# Patient Record
Sex: Female | Born: 1985 | Race: White | Hispanic: No | Marital: Single | Smoking: Never smoker
Health system: Southern US, Community
[De-identification: ages and names within clinical notes are randomized; demographics above are authoritative.]

## PROBLEM LIST (undated history)

## (undated) DIAGNOSIS — M069 Rheumatoid arthritis, unspecified: Secondary | ICD-10-CM

## (undated) HISTORY — PX: WISDOM TOOTH EXTRACTION: SHX21

---

## 2013-05-25 ENCOUNTER — Encounter (HOSPITAL_COMMUNITY): Payer: Self-pay | Admitting: Emergency Medicine

## 2013-05-25 ENCOUNTER — Emergency Department (HOSPITAL_COMMUNITY)
Admission: EM | Admit: 2013-05-25 | Discharge: 2013-05-25 | Disposition: A | Discharge status: Home / Self Care | Payer: Self-pay | Attending: Emergency Medicine | Admitting: Emergency Medicine

## 2013-05-25 DIAGNOSIS — M069 Rheumatoid arthritis, unspecified: Secondary | ICD-10-CM | POA: Insufficient documentation

## 2013-05-25 DIAGNOSIS — IMO0002 Reserved for concepts with insufficient information to code with codable children: Secondary | ICD-10-CM | POA: Insufficient documentation

## 2013-05-25 DIAGNOSIS — Z79899 Other long term (current) drug therapy: Secondary | ICD-10-CM | POA: Insufficient documentation

## 2013-05-25 DIAGNOSIS — M79642 Pain in left hand: Secondary | ICD-10-CM

## 2013-05-25 HISTORY — DX: Rheumatoid arthritis, unspecified: M06.9

## 2013-05-25 MED ORDER — PREDNISONE 20 MG PO TABS: 40.0000 mg | ORAL_TABLET | Freq: Every day | ORAL | Status: DC

## 2013-05-25 MED ORDER — IBUPROFEN 800 MG PO TABS
800.0000 mg | ORAL_TABLET | Freq: Once | ORAL | Status: AC
  Administered 2013-05-25: 800 mg via ORAL
  Filled 2013-05-25: qty 1

## 2013-05-25 MED ORDER — ACETAMINOPHEN 500 MG PO TABS: 1000.0000 mg | ORAL_TABLET | Freq: Once | ORAL | Status: DC

## 2013-05-25 MED ORDER — IBUPROFEN 800 MG PO TABS: 800.0000 mg | ORAL_TABLET | Freq: Once | ORAL | Status: DC

## 2013-05-25 MED ORDER — ACETAMINOPHEN 500 MG PO TABS
1000.0000 mg | ORAL_TABLET | Freq: Once | ORAL | Status: AC
  Administered 2013-05-25: 1000 mg via ORAL
  Filled 2013-05-25: qty 2

## 2013-05-25 MED ORDER — DEXAMETHASONE SODIUM PHOSPHATE 10 MG/ML IJ SOLN
10.0000 mg | Freq: Once | INTRAMUSCULAR | Status: AC
  Administered 2013-05-25: 10 mg via INTRAMUSCULAR
  Filled 2013-05-25: qty 1

## 2013-05-25 NOTE — ED Notes (Addendum)
Alert, NAD, calm, interactive, pt given warm packs per request, declined ice pack at this time, wanted to try heat tx. 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

## 2013-05-25 NOTE — ED Notes (Addendum)
C/o L hand pain, onset ~ 2 weeks ago, c/w past RA flare ups, some tingling, denies other sx, has tried ibuprofen, hydrocodone, aspercreme, ice, heat. Rates pain 7/10. Describes as crushing. Pinpoints pain to L index, radiates to hand and up to mid FA. Some swelling of L index noted, no s/sx of infection. Denies fever. No redness, bruising or other markings noted. Denies injury. H/o similar.

## 2013-05-25 NOTE — ED Provider Notes (Signed)
CSN: [id number]     Arrival date & time 05/25/13  0605 History   First MD Initiated Contact with Patient 05/25/13 [phone]     Chief Complaint  Patient presents with  . Hand Pain   (Consider location/radiation/quality/duration/timing/severity/associated sxs/prior Treatment) HPI Pt is a 27yo female with hx of rheumatoid arthritis c/o left hand pain that has progressively worsened over the last month, worse within the 1-2 weeks.  Symptoms worst in left index finger. Pain is constant, crushing, 7/10, worse with movement and palpation, associated with intermittent tingling. Pain radiates on occasion all the way to left shoulder.  Also associated with moderate swelling of left index finger, decreased ROM.  Has tried ibuprofen, hydrocodone, aspercreme, ice and heat with minimal relief. Pt is right handed but states she uses left index finger often to help hold her cell phone.  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  States she normally has flares in her right hand, last one was several years ago and required joint injections. Denies fever, n/v/d. Denies recent injury or fall.  Past Medical History  Diagnosis Date  . Rheumatoid arthritis    Past Surgical History  Procedure Laterality Date  . Wisdom tooth extraction     No family history on file. History  Substance Use Topics  . Smoking status: Never Smoker   . Smokeless tobacco: Not on file  . Alcohol Use: Yes   OB History   Grav Para Term Preterm Abortions TAB SAB Ect Mult Living                 Review of Systems  Constitutional: Negative for fever, chills and fatigue.  Musculoskeletal: Positive for arthralgias, joint swelling and myalgias.       Left hand, worse in index finger.  Skin: Negative for color change, rash and wound.  All other systems reviewed and are negative.    Allergies  Review of patient's allergies indicates no known allergies.  Home Medications    Current Outpatient Rx  Name  Route  Sig  Dispense  Refill  . HYDROcodone-acetaminophen (NORCO) 7.5-325 MG per tablet   Oral   Take 1 tablet by mouth every 6 (six) hours as needed for pain.         Marland Kitchen ibuprofen (ADVIL,MOTRIN) 800 MG tablet   Oral   Take 800 mg by mouth every 8 (eight) hours as needed for pain.         Marland Kitchen acetaminophen (TYLENOL) 500 MG tablet   Oral   Take 2 tablets (1,000 mg total) by mouth once.   30 tablet   0   . ibuprofen (ADVIL,MOTRIN) 800 MG tablet   Oral   Take 1 tablet (800 mg total) by mouth once.   30 tablet   0   . predniSONE (DELTASONE) 20 MG tablet   Oral   Take 2 tablets (40 mg total) by mouth daily.   10 tablet   0    BP 126/81  Pulse 98  Temp(Src) 98.1 F (36.7 C) (Oral)  Resp 16  Ht 5\' 2"  (1.575 m)  Wt 166 lb (75.297 kg)  BMI 30.35 kg/m2  SpO2 100%  LMP 05/01/2013 Physical Exam  Nursing note and vitals reviewed. Constitutional: She is oriented to person, place, and time. She appears well-developed and well-nourished.  HENT:  Head: Normocephalic and atraumatic.  Eyes: EOM are normal.  Neck: Normal range of motion.  Cardiovascular: Normal rate.  Pulmonary/Chest: Effort normal.  Musculoskeletal: She exhibits edema and tenderness.       Left hand: She exhibits decreased range of motion and tenderness. She exhibits normal capillary refill, no deformity and no laceration. Decreased strength noted. She exhibits thumb/finger opposition.       Hands: Moderate edema with mild erythema and warmth of left index finger, TTP. Difficulty with index to thumb opposition.  Limited finger flexion due to pain and swelling.  TTP along thumb and middle finger. No pain in anatomical snuff box.  No deformity. No ecchymosis.  Neurological: She is alert and oriented to person, place, and time.  Skin: Skin is warm and dry.  Psychiatric: She has a normal mood and affect. Her behavior is normal.    ED Course  Procedures (including critical care  time) Labs Review Labs Reviewed - No data to display Imaging Review No results found.  EKG Interpretation   None       MDM   1. Rheumatoid arthritis flare   2. Left hand pain    Pt presenting with flare up of RA in left index finger.  Denies fever, n/v/d. Denies injury to hand.  Report symptoms consistent with previous RA flares.  Not concerned for fracture, dislocation, or soft tissue infection. 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 Tx in ED: decadron, ibuprofen, acetaminophen.    Rx: acetaminophen, ibuprofen, prednisone x 5 days.  All questions answered and concerns addressed. Will discharge pt home and have pt f/u with [facility] info provided. Return precautions given. Pt verbalized understanding and agreement with tx plan. Vitals: unremarkable. Discharged in stable condition.    Discussed pt with attending during ED encounter and agrees with plan.      Junius Finner, PA-C 05/25/13 [phone]

## 2013-05-27 NOTE — ED Provider Notes (Signed)
Medical screening examination/treatment/procedure(s) were performed by non-physician practitioner and as supervising physician I was immediately available for consultation/collaboration.   Loren Racer, MD 05/27/13 1537

## 2013-07-20 ENCOUNTER — Encounter (HOSPITAL_COMMUNITY): Payer: Self-pay | Admitting: Emergency Medicine

## 2013-07-20 ENCOUNTER — Emergency Department (HOSPITAL_COMMUNITY)
Admission: EM | Admit: 2013-07-20 | Discharge: 2013-07-21 | Disposition: A | Discharge status: Home / Self Care | Payer: No Typology Code available for payment source | Attending: Emergency Medicine | Admitting: Emergency Medicine

## 2013-07-20 ENCOUNTER — Emergency Department (HOSPITAL_COMMUNITY): Payer: No Typology Code available for payment source

## 2013-07-20 DIAGNOSIS — S63509A Unspecified sprain of unspecified wrist, initial encounter: Secondary | ICD-10-CM | POA: Insufficient documentation

## 2013-07-20 DIAGNOSIS — IMO0002 Reserved for concepts with insufficient information to code with codable children: Secondary | ICD-10-CM | POA: Insufficient documentation

## 2013-07-20 DIAGNOSIS — S0300XA Dislocation of jaw, unspecified side, initial encounter: Secondary | ICD-10-CM

## 2013-07-20 DIAGNOSIS — Y92410 Unspecified street and highway as the place of occurrence of the external cause: Secondary | ICD-10-CM | POA: Insufficient documentation

## 2013-07-20 DIAGNOSIS — Z8739 Personal history of other diseases of the musculoskeletal system and connective tissue: Secondary | ICD-10-CM | POA: Insufficient documentation

## 2013-07-20 DIAGNOSIS — S0990XA Unspecified injury of head, initial encounter: Secondary | ICD-10-CM | POA: Insufficient documentation

## 2013-07-20 DIAGNOSIS — S43401A Unspecified sprain of right shoulder joint, initial encounter: Secondary | ICD-10-CM

## 2013-07-20 DIAGNOSIS — Y9389 Activity, other specified: Secondary | ICD-10-CM | POA: Insufficient documentation

## 2013-07-20 NOTE — ED Notes (Signed)
Patient transported to X-ray 
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
## 2013-07-21 MED ORDER — NAPROXEN 500 MG PO TABS: 500.0000 mg | ORAL_TABLET | Freq: Two times a day (BID) | ORAL | Status: AC

## 2013-07-21 MED ORDER — DIAZEPAM 5 MG PO TABS: 5.0000 mg | ORAL_TABLET | Freq: Three times a day (TID) | ORAL | Status: DC | PRN

## 2013-07-21 MED ORDER — HYDROCODONE-ACETAMINOPHEN 5-325 MG PO TABS
1.0000 | ORAL_TABLET | Freq: Once | ORAL | Status: AC
  Administered 2013-07-21: 1 via ORAL
  Filled 2013-07-21: qty 1

## 2013-07-21 MED ORDER — TRAMADOL HCL 50 MG PO TABS: 50.0000 mg | ORAL_TABLET | Freq: Four times a day (QID) | ORAL | Status: DC | PRN

## 2013-07-21 NOTE — ED Provider Notes (Signed)
Medical screening examination/treatment/procedure(s) were performed by non-physician practitioner and as supervising physician I was immediately available for consultation/collaboration.  EKG Interpretation   None         Megan E Docherty, MD 07/21/13 0113 

## 2013-07-28 ENCOUNTER — Emergency Department (HOSPITAL_COMMUNITY)
Admission: EM | Admit: 2013-07-28 | Discharge: 2013-07-28 | Disposition: A | Discharge status: Home / Self Care | Payer: No Typology Code available for payment source | Attending: Emergency Medicine | Admitting: Emergency Medicine

## 2013-07-28 ENCOUNTER — Encounter (HOSPITAL_COMMUNITY): Payer: Self-pay | Admitting: Emergency Medicine

## 2013-07-28 DIAGNOSIS — R6884 Jaw pain: Secondary | ICD-10-CM | POA: Insufficient documentation

## 2013-07-28 DIAGNOSIS — Z79899 Other long term (current) drug therapy: Secondary | ICD-10-CM | POA: Insufficient documentation

## 2013-07-28 DIAGNOSIS — S63501S Unspecified sprain of right wrist, sequela: Secondary | ICD-10-CM

## 2013-07-28 DIAGNOSIS — IMO0002 Reserved for concepts with insufficient information to code with codable children: Secondary | ICD-10-CM | POA: Insufficient documentation

## 2013-07-28 DIAGNOSIS — Y939 Activity, unspecified: Secondary | ICD-10-CM | POA: Insufficient documentation

## 2013-07-28 DIAGNOSIS — S46911S Strain of unspecified muscle, fascia and tendon at shoulder and upper arm level, right arm, sequela: Secondary | ICD-10-CM

## 2013-07-28 DIAGNOSIS — S034XXS Sprain of jaw, sequela: Secondary | ICD-10-CM

## 2013-07-28 DIAGNOSIS — S63509A Unspecified sprain of unspecified wrist, initial encounter: Secondary | ICD-10-CM | POA: Insufficient documentation

## 2013-07-28 DIAGNOSIS — S0340XA Sprain of jaw, unspecified side, initial encounter: Secondary | ICD-10-CM | POA: Insufficient documentation

## 2013-07-28 DIAGNOSIS — M069 Rheumatoid arthritis, unspecified: Secondary | ICD-10-CM | POA: Insufficient documentation

## 2013-07-28 DIAGNOSIS — Y92410 Unspecified street and highway as the place of occurrence of the external cause: Secondary | ICD-10-CM | POA: Insufficient documentation

## 2013-07-28 MED ORDER — DIAZEPAM 5 MG PO TABS: 5.0000 mg | ORAL_TABLET | Freq: Two times a day (BID) | ORAL | Status: AC

## 2013-07-28 MED ORDER — TRAMADOL HCL 50 MG PO TABS: 50.0000 mg | ORAL_TABLET | Freq: Four times a day (QID) | ORAL | Status: DC | PRN
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
## 2013-08-05 NOTE — ED Provider Notes (Signed)
Medical screening examination/treatment/procedure(s) were performed by non-physician practitioner and as supervising physician I was immediately available for consultation/collaboration.  EKG Interpretation   None        Raeford Razor, MD 08/05/13 1710

## 2014-01-17 ENCOUNTER — Encounter (HOSPITAL_COMMUNITY): Payer: Self-pay | Admitting: Emergency Medicine

## 2014-01-17 ENCOUNTER — Emergency Department (HOSPITAL_COMMUNITY)
Admission: EM | Admit: 2014-01-17 | Discharge: 2014-01-17 | Disposition: A | Discharge status: Home / Self Care | Payer: No Typology Code available for payment source | Attending: Emergency Medicine | Admitting: Emergency Medicine

## 2014-01-17 ENCOUNTER — Emergency Department (HOSPITAL_COMMUNITY): Payer: No Typology Code available for payment source

## 2014-01-17 DIAGNOSIS — Z79899 Other long term (current) drug therapy: Secondary | ICD-10-CM | POA: Insufficient documentation

## 2014-01-17 DIAGNOSIS — X58XXXA Exposure to other specified factors, initial encounter: Secondary | ICD-10-CM | POA: Insufficient documentation

## 2014-01-17 DIAGNOSIS — M069 Rheumatoid arthritis, unspecified: Secondary | ICD-10-CM | POA: Insufficient documentation

## 2014-01-17 DIAGNOSIS — Y929 Unspecified place or not applicable: Secondary | ICD-10-CM | POA: Insufficient documentation

## 2014-01-17 DIAGNOSIS — S93409A Sprain of unspecified ligament of unspecified ankle, initial encounter: Secondary | ICD-10-CM

## 2014-01-17 DIAGNOSIS — Z3202 Encounter for pregnancy test, result negative: Secondary | ICD-10-CM | POA: Insufficient documentation

## 2014-01-17 DIAGNOSIS — Z791 Long term (current) use of non-steroidal anti-inflammatories (NSAID): Secondary | ICD-10-CM | POA: Insufficient documentation

## 2014-01-17 DIAGNOSIS — Y939 Activity, unspecified: Secondary | ICD-10-CM | POA: Insufficient documentation

## 2014-01-17 LAB — POC URINE PREG, ED: PREG TEST UR: NEGATIVE

## 2014-01-17 MED ORDER — OXYCODONE-ACETAMINOPHEN 5-325 MG PO TABS
1.0000 | ORAL_TABLET | Freq: Once | ORAL | Status: AC
  Administered 2014-01-17: 1 via ORAL
  Filled 2014-01-17: qty 1

## 2014-01-17 MED ORDER — NAPROXEN 375 MG PO TABS: 375.0000 mg | ORAL_TABLET | Freq: Two times a day (BID) | ORAL | Status: AC
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
## 2014-01-17 NOTE — Discharge Instructions (Signed)
Please call your doctor for a followup appointment within 24-48 hours. When you talk to your doctor please let them know that you were seen in the emergency department and have them acquire all of your records so that they can discuss the findings with you and formulate a treatment plan to fully care for your new and ongoing problems. Please call and set-up an appointment with Orthopedics Please rest and stay hydrated Please rest, ice, and elevate - toes above nose Please avoid any physical or strenuous activity Please keep ankle in brace when active and please use crutches Please continue to monitor symptoms closely and if symptoms are to worsen or change (fever greater than 101, chills, sweating, nausea, vomiting, fall, injury, numbness, tingling, swelling, redness, hot to the touch) please report back to the ED immediately     Ankle Sprain An ankle sprain is an injury to the strong, fibrous tissues (ligaments) that hold the bones of your ankle joint together.  CAUSES An ankle sprain is usually caused by a fall or by twisting your ankle. Ankle sprains most commonly occur when you step on the outer edge of your foot, and your ankle turns inward. People who participate in sports are more prone to these types of injuries.  SYMPTOMS   Pain in your ankle. The pain may be present at rest or only when you are trying to stand or walk.  Swelling.  Bruising. Bruising may develop immediately or within 1 to 2 days after your injury.  Difficulty standing or walking, particularly when turning corners or changing directions. DIAGNOSIS  Your caregiver will ask you details about your injury and perform a physical exam of your ankle to determine if you have an ankle sprain. During the physical exam, your caregiver will press on and apply pressure to specific areas of your foot and ankle. Your caregiver will try to move your ankle in certain ways. An X-ray exam may be done to be sure a bone was not broken or  a ligament did not separate from one of the bones in your ankle (avulsion fracture).  TREATMENT  Certain types of braces can help stabilize your ankle. Your caregiver can make a recommendation for this. Your caregiver may recommend the use of medicine for pain. If your sprain is severe, your caregiver may refer you to a surgeon who helps to restore function to parts of your skeletal system (orthopedist) or a physical therapist. HOME CARE INSTRUCTIONS   Apply ice to your injury for 1 2 days or as directed by your caregiver. Applying ice helps to reduce inflammation and pain.  Put ice in a plastic bag.  Place a towel between your skin and the bag.  Leave the ice on for 15-20 minutes at a time, every 2 hours while you are awake.  Only take over-the-counter or prescription medicines for pain, discomfort, or fever as directed by your caregiver.  Elevate your injured ankle above the level of your heart as much as possible for 2 3 days.  If your caregiver recommends crutches, use them as instructed. Gradually put weight on the affected ankle. Continue to use crutches or a cane until you can walk without feeling pain in your ankle.  If you have a plaster splint, wear the splint as directed by your caregiver. Do not rest it on anything harder than a pillow for the first 24 hours. Do not put weight on it. Do not get it wet. You may take it off to take a shower  or bath.  You may have been given an elastic bandage to wear around your ankle to provide support. If the elastic bandage is too tight (you have numbness or tingling in your foot or your foot becomes cold and blue), adjust the bandage to make it comfortable.  If you have an air splint, you may blow more air into it or let air out to make it more comfortable. You may take your splint off at night and before taking a shower or bath. Wiggle your toes in the splint several times per day to decrease swelling. SEEK MEDICAL CARE IF:   You have rapidly  increasing bruising or swelling.  Your toes feel extremely cold or you lose feeling in your foot.  Your pain is not relieved with medicine. SEEK IMMEDIATE MEDICAL CARE IF:  Your toes are numb or blue.  You have severe pain that is increasing. MAKE SURE YOU:   Understand these instructions.  Will watch your condition.  Will get help right away if you are not doing well or get worse. Document Released: 07/28/2005 Document Revised: 04/21/2012 Document Reviewed: 08/09/2011 [facility] Patient Information 2014 [employer] [place].   Emergency Department Resource Guide 1) Find a Doctor and Pay Out of Pocket Although you won't have to find out who is covered by your insurance plan, it is a good idea to ask around and get recommendations. You will then need to call the office and see if the doctor you have chosen will accept you as a new patient and what types of options they offer for patients who are self-pay. Some doctors offer discounts or will set up payment plans for their patients who do not have insurance, but you will need to ask so you aren't surprised when you get to your appointment.  2) Contact Your Local Health Department Not all health departments have doctors that can see patients for sick visits, but many do, so it is worth a call to see if yours does. If you don't know where your local health department is, you can check in your phone book. The CDC also has a tool to help you locate your state's health department, and many state websites also have listings of all of their local health departments.  3) Find a Walk-in Clinic If your illness is not likely to be very severe or complicated, you may want to try a walk in clinic. These are popping up all over the country in pharmacies, drugstores, and shopping centers. They're usually staffed by nurse practitioners or physician assistants that have been trained to treat common illnesses and complaints. They're usually fairly quick and  inexpensive. However, if you have serious medical issues or chronic medical problems, these are probably not your best option.  No Primary Care Doctor: - Call Health Connect at  [phone] - they can help you locate a primary care doctor that  accepts your insurance, provides certain services, etc. - Physician Referral Service- [phone]  Chronic Pain Problems: Organization         Address  Phone   Notes  [facility]  [phone] Patients need to be referred by their primary care doctor.   Medication Assistance: Organization         Address  Phone   Notes  [facility] [address] [place], [place] [phone] --Must be a resident of [facility] [place] -- Must have NO insurance coverage whatsoever (no Medicaid/ Medicare, etc.) -- The  pt. MUST have a primary care doctor that directs their care regularly and follows them in the community   [facility]ist  [phone]   [employer]  [phone]    Agencies that provide inexpensive medical care: Organization         Address  Phone   Notes  [facility]  [phone]   [facility]    [phone]   [facility] [address] [place], [place] [address] [phone]   [facility] [address] [place] [place] [phone]   Planned Parenthood    [phone]   [facility]    [phone]   [facility]  [address], [place] Phone:  [phone], Fax:  [phone] Hours of Operation:  9 am - 6 pm, M-F.  Also accepts Medicaid/Medicare and self-pay.  [facility]  [address], [place] Phone: [phone], Fax: [phone]. Hours of Operation:  8:30 am - 5:30 pm, M-F.  Also accepts Medicaid and self-pay.  [facility] [place] [address], [place] Phone: [phone]   [facility] [address] Natasha Bence [place], [place] [phone], Ext. [phone] Mondays & Thursdays: 7-9 AM.  First 15 patients are seen on a first come, first serve basis.    Medicaid-accepting [facility] Providers:  Organization         Address  Phone   Notes  [facility] [address], [place] [phone] Also accepts self-pay patients.  [facility] [address] Laurell Josephs [place], [place]  [phone]   [facility] [place] [facility] [address], [place] [phone]   [facility] [address], [place] [phone]   Renaye Rakers [address], [place]   [phone] Only accepts [place] Access [place] patients after they have their name applied to their card.   Self-Pay (no insurance) in [facility]:  Organization         Address  Phone   Notes  Sickle Cell Patients, [facility] [address] [place], [place] [phone]   [facility] [address] [place], [place] [phone]   [facility]   [address],  [phone]   [facility]/Dr. Osei-Bonsu  [address] [place] or [address], [place] [phone] Phone number for both [place] and [place] locations is the same.  [facility] [address] [place] [phone]   [facility] [address], [place] or [address] [phone]   [facility] [address], [place] [phone], phone; [phone], fax Sees patients 1st and 3rd Saturday of every month.  Must not qualify for public or private insurance (i.e. Medicaid, Medicare, [place] Health Choice, Veterans' Benefits)  Household income should be no more than 200% of the poverty level The clinic cannot treat you if you are pregnant or  think you are pregnant  Sexually transmitted diseases are not treated at the clinic.    Dental Care: Organization         Address  Phone  Notes  [facility] [address]  Joellyn Quails, [place] [phone] Accepts children up to age 90 who are enrolled in Medicaid or [place] Health Choice; pregnant women with a Medicaid card; and children who have applied for Medicaid or [place] Health Choice, but were declined, whose parents can pay a reduced fee at time of service.  [facility] of [facility]  [address], [place] [phone] Accepts children up to age 68 who are enrolled in [place] or [place] Health Choice; pregnant women with a Medicaid card; and children who have applied for Medicaid or  Health Choice, but were declined, whose parents can pay a reduced fee at time of service.  [facility]  [address] [place], [place] [phone] Patients are seen by appointment only. Walk-ins are not accepted. [facility] will see patients 57 years of age and older. Monday - Tuesday (8am-5pm) Most Wednesdays (8:30-5pm) $30 per visit, cash only  [facility]  [address], [facility] [phone] Patients are seen by appointment only. Walk-ins are not accepted. [facility] will see patients 92 years of age and older. One Wednesday Evening (Monthly: Volunteer Based).  $30 per visit, cash only  [employer]  [phone] for adults; Children under age 36, call Graduate Pediatric Dentistry at [phone]. Children aged 23-14, please call [phone] to request a pediatric application.  Dental services are provided in all areas of dental care including fillings, crowns and bridges, complete and partial dentures, implants, gum treatment, root canals, and extractions. Preventive care is also provided. Treatment is provided to both adults  and children. Patients are selected via a lottery and there is often a waiting list.   [facility] [address] [place]  [phone] [email or web address]   [facility] [address] [place], [place] [phone], Ext. [phone] Second and Fourth Thursday of each month, opens at 6:30 AM; Clinic ends at 9 AM.  Patients are seen on a first-come first-served basis, and a limited number are seen during each clinic.   [facility]  [address] Ether Griffins [place], [place] [phone]   Eligibility Requirements You must have lived in [place], [place], or [place] counties for at least the last three months.   You cannot be eligible for state or federal sponsored [place], including [employer], [place], or [employer].   You generally cannot be eligible for healthcare insurance through your employer.    How to apply: Eligibility screenings are held every Tuesday and Wednesday afternoon from 1:00 pm until 4:00 pm. You do not need an appointment for the interview!  [facility] [address] [place], [place] [phone]   [facility]  [phone]   [facility] Health Department  [phone]   [facility] Health Department  [phone]    Behavioral Health Resources in the Community: Intensive Outpatient Programs Organization         Address  Phone  Notes  [facility] [address], [place], [place] [phone]   [facility] Outpatient [address], [place], [place] [phone]   [facility] [address] [place], [place]  [phone]   [facility] Mental Health [address]  [place], [place] [phone] or [phone]   Substance Abuse Resources Organization         Address  Phone  Notes  Alcohol and Drug  Services  [phone]   [facility]  [phone]   [facility]  [phone]     Floydene Flock  [phone]   Residential & Outpatient Substance Abuse Program  [phone]   Psychological Services Organization         Address  Phone  Notes  [facility] Health  [phone]   [facility] Services  [phone]   [facility] [address], [place] [phone] or [phone]    Mobile Crisis Teams Organization         Address  Phone  Notes  [facility], Mobile Crisis Care Unit  [phone]   [facility]  [address] [place], [place] [phone]   Doristine Locks [address] [place] [phone]    Self-Help/Support Groups Organization         Address  Phone             Notes  [facility]oc[facility] [place] - variety of support groups  336- I7437963 Call for more information  Narcotics Anonymous (NA), [facility] [address], [employer] [place]  2 meetings at this location   Statistician         Address  Phone  Notes  [facility] [address],    [place]  [phone]   [facility]  [address], [place] [address], [place], [place] [phone]   [facility] [address] [place], [place] [phone] Admissions: 8am-3pm M-F  [facility] [address]    [place], [place] [phone]   [facility] [address] [place], [place], [place] [phone]   [facility] [address]  [place], [place] [phone]   [facility] - Intensive Outpatient [address] Laurell Joseph[address], [place], [place] [phone]   [facility] ([facility] [address] [place].,  [place], [place] [phone] or [phone]   [facility] ([facility]) [address] [place], [place] [phone] Accepts Medicaid  [facility] [place] [address]  [place] [phone] Substance Abuse/Addiction Treatment   [facility] Organization         Address  Phone  Notes  [facility]  [phone]   Angie Fava, PhD [address] Ervin Knack [place], [place]   [phone] or [phone]   [facility]   [address] [place], [place] [phone]   [facility] [address] [place], [place] [phone] Insurance/Medicaid/sponsorship through [place] [facility] [address]                                    [place], [place] [phone] Therapy/tele-psych/case  [facility] [address], [place] [phone]    Dr. Lolly Mustache  [phone]   [facility] [place]  [employer] [facility] Dept. 1) [address] [place] 2) [address], [place] 3)  [address], [place] [phone]  [phone]   [facility] Child Abuse Hotline (769)730-1440 or [phone] (After Hours)

## 2014-01-18 NOTE — ED Provider Notes (Signed)
Medical screening examination/treatment/procedure(s) were performed by non-physician practitioner and as supervising physician I was immediately available for consultation/collaboration.   EKG Interpretation None       Olivia Mackie, MD 01/18/14 1402

## 2014-03-20 ENCOUNTER — Other Ambulatory Visit (HOSPITAL_COMMUNITY): Payer: Self-pay | Admitting: Orthopaedic Surgery

## 2014-03-20 DIAGNOSIS — M25531 Pain in right wrist: Secondary | ICD-10-CM

## 2014-04-22 ENCOUNTER — Emergency Department (HOSPITAL_COMMUNITY)
Admission: EM | Admit: 2014-04-22 | Discharge: 2014-04-22 | Disposition: A | Discharge status: Home / Self Care | Payer: Self-pay | Attending: Emergency Medicine | Admitting: Emergency Medicine

## 2014-04-22 ENCOUNTER — Encounter (HOSPITAL_COMMUNITY): Payer: Self-pay | Admitting: Emergency Medicine

## 2014-04-22 ENCOUNTER — Emergency Department (HOSPITAL_COMMUNITY): Payer: No Typology Code available for payment source

## 2014-04-22 DIAGNOSIS — M25579 Pain in unspecified ankle and joints of unspecified foot: Secondary | ICD-10-CM | POA: Insufficient documentation

## 2014-04-22 DIAGNOSIS — G8911 Acute pain due to trauma: Secondary | ICD-10-CM | POA: Insufficient documentation

## 2014-04-22 DIAGNOSIS — Z791 Long term (current) use of non-steroidal anti-inflammatories (NSAID): Secondary | ICD-10-CM | POA: Insufficient documentation

## 2014-04-22 DIAGNOSIS — Z79899 Other long term (current) drug therapy: Secondary | ICD-10-CM | POA: Insufficient documentation

## 2014-04-22 DIAGNOSIS — M069 Rheumatoid arthritis, unspecified: Secondary | ICD-10-CM | POA: Insufficient documentation

## 2014-04-22 DIAGNOSIS — M25571 Pain in right ankle and joints of right foot: Secondary | ICD-10-CM
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
## 2014-04-22 NOTE — Discharge Instructions (Signed)

## 2014-04-22 NOTE — Progress Notes (Signed)
Orthopedic Tech Progress Note Patient Details:  Gwendolyn Watson Dec 27, 1985 [id number]  Ortho Devices Type of Ortho Device: CAM walker;Crutches Ortho Device/Splint Location: rle Ortho Device/Splint Interventions: Application   Nikki Dom 04/22/2014, 9:45 PM

## 2014-04-23 NOTE — ED Provider Notes (Signed)
Medical screening examination/treatment/procedure(s) were performed by non-physician practitioner and as supervising physician I was immediately available for consultation/collaboration.   EKG Interpretation None        Matthew Gentry, MD 04/23/14 2336 

## 2014-04-27 ENCOUNTER — Other Ambulatory Visit (HOSPITAL_COMMUNITY): Payer: Self-pay | Admitting: Orthopaedic Surgery

## 2014-04-27 DIAGNOSIS — M25531 Pain in right wrist: Secondary | ICD-10-CM

## 2014-05-10 ENCOUNTER — Ambulatory Visit (HOSPITAL_COMMUNITY)
Admission: RE | Admit: 2014-05-10 | Discharge: 2014-05-10 | Disposition: A | Discharge status: Home / Self Care | Payer: No Typology Code available for payment source | Source: Ambulatory Visit | Attending: Orthopaedic Surgery | Admitting: Orthopaedic Surgery

## 2014-05-10 DIAGNOSIS — M6740 Ganglion, unspecified site: Secondary | ICD-10-CM | POA: Insufficient documentation

## 2014-05-10 DIAGNOSIS — M25531 Pain in right wrist: Secondary | ICD-10-CM

## 2014-05-10 DIAGNOSIS — M65849 Other synovitis and tenosynovitis, unspecified hand: Principal | ICD-10-CM

## 2014-05-10 DIAGNOSIS — M65839 Other synovitis and tenosynovitis, unspecified forearm: Secondary | ICD-10-CM | POA: Insufficient documentation

## 2014-06-29 ENCOUNTER — Other Ambulatory Visit (HOSPITAL_COMMUNITY): Payer: Self-pay | Admitting: Orthopaedic Surgery

## 2014-06-29 DIAGNOSIS — M25571 Pain in right ankle and joints of right foot: Secondary | ICD-10-CM

## 2014-07-13 ENCOUNTER — Other Ambulatory Visit (HOSPITAL_COMMUNITY): Payer: Self-pay | Admitting: Orthopaedic Surgery

## 2014-07-13 DIAGNOSIS — M25571 Pain in right ankle and joints of right foot: Secondary | ICD-10-CM

## 2014-07-21 ENCOUNTER — Ambulatory Visit (HOSPITAL_COMMUNITY)
Admission: RE | Admit: 2014-07-21 | Discharge: 2014-07-21 | Disposition: A | Discharge status: Home / Self Care | Payer: Self-pay | Source: Ambulatory Visit | Attending: Orthopaedic Surgery | Admitting: Orthopaedic Surgery

## 2014-07-21 DIAGNOSIS — M25571 Pain in right ankle and joints of right foot: Secondary | ICD-10-CM | POA: Insufficient documentation

## 2014-07-28 ENCOUNTER — Ambulatory Visit (HOSPITAL_COMMUNITY): Payer: No Typology Code available for payment source

## 2016-06-26 ENCOUNTER — Encounter (HOSPITAL_COMMUNITY): Payer: Self-pay | Admitting: Emergency Medicine

## 2016-06-26 ENCOUNTER — Emergency Department (HOSPITAL_COMMUNITY): Payer: BLUE CROSS/BLUE SHIELD

## 2016-06-26 DIAGNOSIS — R0602 Shortness of breath: Secondary | ICD-10-CM | POA: Insufficient documentation

## 2016-06-26 LAB — BASIC METABOLIC PANEL
Anion gap: 9 (ref 5–15)
BUN: 16 mg/dL (ref 6–20)
CHLORIDE: 110 mmol/L (ref 101–111)
CO2: 21 mmol/L — ABNORMAL LOW (ref 22–32)
Calcium: 8.2 mg/dL — ABNORMAL LOW (ref 8.9–10.3)
Creatinine, Ser: 0.69 mg/dL (ref 0.44–1.00)
GFR calc Af Amer: >60 mL/min (ref >60)
GFR calc non Af Amer: >60 mL/min (ref >60)
GLUCOSE: 106 mg/dL — AB (ref 65–99)
POTASSIUM: 3.5 mmol/L (ref 3.5–5.1)
Sodium: 140 mmol/L (ref 135–145)

## 2016-06-26 LAB — CBC
HEMATOCRIT: 34.1 % — AB (ref 36.0–46.0)
Hemoglobin: 10.9 g/dL — ABNORMAL LOW (ref 12.0–15.0)
MCH: 28.2 pg (ref 26.0–34.0)
MCHC: 32 g/dL (ref 30.0–36.0)
MCV: 88.1 fL (ref 78.0–100.0)
Platelets: 382 10*3/uL (ref 150–400)
RBC: 3.87 MIL/uL (ref 3.87–5.11)
RDW: 13.3 % (ref 11.5–15.5)
WBC: 8.8 10*3/uL (ref 4.0–10.5)

## 2016-06-26 LAB — D-DIMER, QUANTITATIVE: D-Dimer, Quant: 2.94 ug/mL-FEU — ABNORMAL HIGH (ref 0.00–0.50)

## 2016-06-26 LAB — I-STAT TROPONIN, ED: Troponin i, poc: 0 ng/mL (ref 0.00–0.08)

## 2016-06-26 NOTE — ED Triage Notes (Signed)
Pt states "i had an allergic reaction to my birth control on Saturday, I put the patch on Saturday and by monday, I was covered in hives and dizzy and fatigued, where the patch was my skin had a big reaction. I went to UC and they gave me a shot of dexamethasone. Yesterday i've been watching my breathing and my doctor thinks it might be a pulmonary embolism so they sent me here to get a CT scan to make sure I dont have a blood clot in my lungs".

## 2016-06-27 ENCOUNTER — Emergency Department (HOSPITAL_COMMUNITY): Payer: BLUE CROSS/BLUE SHIELD

## 2016-06-27 ENCOUNTER — Emergency Department (HOSPITAL_COMMUNITY)
Admission: EM | Admit: 2016-06-27 | Discharge: 2016-06-27 | Disposition: A | Discharge status: Home / Self Care | Payer: BLUE CROSS/BLUE SHIELD | Attending: Emergency Medicine | Admitting: Emergency Medicine

## 2016-06-27 DIAGNOSIS — R0602 Shortness of breath: Secondary | ICD-10-CM

## 2016-06-27 LAB — I-STAT BETA HCG BLOOD, ED (MC, WL, AP ONLY): I-stat hCG, quantitative: <5 m[IU]/mL (ref <5)

## 2016-06-27 MED ORDER — IOPAMIDOL (ISOVUE-370) INJECTION 76%
INTRAVENOUS | Status: AC
  Administered 2016-06-27: 100 mL
  Filled 2016-06-27: qty 100

## 2016-06-27 MED ORDER — METHYLPREDNISOLONE SODIUM SUCC 125 MG IJ SOLR: 125.0000 mg | Freq: Once | INTRAMUSCULAR | Status: DC

## 2016-06-27 MED ORDER — SODIUM CHLORIDE 0.9 % IV BOLUS (SEPSIS)
1000.0000 mL | Freq: Once | INTRAVENOUS | Status: AC
Start: 2016-06-27 — End: 2016-06-27
  Administered 2016-06-27: 1000 mL via INTRAVENOUS

## 2016-06-27 MED ORDER — PREDNISONE 20 MG PO TABS
60.0000 mg | ORAL_TABLET | Freq: Once | ORAL | Status: AC
  Administered 2016-06-27: 60 mg via ORAL
  Filled 2016-06-27: qty 3

## 2016-06-27 MED ORDER — ALBUTEROL SULFATE HFA 108 (90 BASE) MCG/ACT IN AERS: 2.0000 | INHALATION_SPRAY | RESPIRATORY_TRACT | 0 refills | Status: AC | PRN

## 2016-06-27 MED ORDER — PREDNISONE 10 MG (21) PO TBPK: 10.0000 mg | ORAL_TABLET | Freq: Every day | ORAL | 0 refills | Status: AC

## 2016-06-27 NOTE — ED Notes (Signed)
Pt questioning discharge instructions and prescriptions, asked to speak with WARD, MD again; will inform her of same

## 2016-06-27 NOTE — ED Notes (Signed)
CT called and notified of patients PIV site
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
## 2016-06-27 NOTE — ED Provider Notes (Signed)
By signing my name below, I, Nelwyn Salisbury, attest that this documentation has been prepared under the direction and in the presence of Kristen N Ward, DO . Electronically Signed: Nelwyn Salisbury, Scribe. 06/27/2016. 2:03 AM.  TIME SEEN: 2:07  CHIEF COMPLAINT: Shortness of Breath  HPI:  Gwendolyn Watson is a 30 y.o. female with pmhx of RA who presents to the Emergency Department complaining of sudden-onset unchanged shortness of breath beginning 5 days ago. Pt states that her symptoms started after she had a strange reaction to her birth control patch 6 days ago. She notes that she has been on her birth control patch for two months, but 6 days ago started developing some swelling and itching at her patch site on the RUE. Pt states that she took the patch off her right arm and reapplied the next day to her left arm. She then states that she noticed she was developing hives, dizziness, fatigue, shortness of breath, and difficulty speaking. Pt called her doctor who advised her to remove the patch. She removed the patch and noticed that he hives had started spreading diffusely across her body. She was seen at urgent care 3 days ago for her symptoms where she was given IM Dexamethasone with mild relief. Pt states that while her hives have cleared, she is still short of breath, especially while walking or talking. She denies any new medications, new soaps/lotions/cosmetics, new foods, or new detergents. Staging she was told by her OB/GYN to come immediately to the hospital to rule out pulmonary embolus. No history of PE, DVT, exogenous estrogen use, fracture, surgery, trauma, hospitalization, prolonged travel. No lower extremity swelling or pain. No calf tenderness.  ROS: See HPI Constitutional: no fever, fatigue Eyes: no drainage  ENT: no runny nose   Cardiovascular:  no chest pain  Resp: SOB  GI: no vomiting GU: no dysuria Integumentary: no rash  Allergy: no hives  Musculoskeletal: no leg  swelling  Neurological: no slurred speech, dizziness, difficulty speaking ROS otherwise negative  PAST MEDICAL HISTORY/PAST SURGICAL HISTORY:  Past Medical History:  Diagnosis Date  . Rheumatoid arthritis (HCC)     MEDICATIONS:  Prior to Admission medications   Medication Sig Start Date End Date Taking? Authorizing Provider  diazepam (VALIUM) 5 MG tablet Take 1 tablet (5 mg total) by mouth 2 (two) times daily. 07/28/13   Marissa Sciacca, PA-C  diclofenac (FLECTOR) 1.3 % PTCH Place 1 patch onto the skin 2 (two) times daily.    Historical Provider, MD  diclofenac sodium (VOLTAREN) 1 % GEL Apply 2 g topically 4 (four) times daily.    Historical Provider, MD  HYDROcodone-acetaminophen (NORCO) 7.5-325 MG per tablet Take 1 tablet by mouth every 6 (six) hours as needed for moderate pain.    Historical Provider, MD  naproxen (NAPROSYN) 375 MG tablet Take 1 tablet (375 mg total) by mouth 2 (two) times daily. 01/17/14   Marissa Sciacca, PA-C  naproxen (NAPROSYN) 500 MG tablet Take 1 tablet (500 mg total) by mouth 2 (two) times daily. 07/21/13   Tatyana Kirichenko, PA-C    ALLERGIES:  No Known Allergies  SOCIAL HISTORY:  Social History  Substance Use Topics  . Smoking status: Never Smoker  . Smokeless tobacco: Not on file  . Alcohol use Yes    FAMILY HISTORY: No family history on file.  EXAM: BP 110/78 (BP Location: Right Arm)   Pulse 82   Temp 98.3 F (36.8 C) (Oral)   Resp 18   LMP 06/10/2016  SpO2 100%  CONSTITUTIONAL: Alert and oriented and responds appropriately to questions. Well-appearing; well-nourished HEAD: Normocephalic EYES: Conjunctivae clear, PERRL, EOMI ENT: normal nose; no rhinorrhea; moist mucous membranes NECK: Supple, no meningismus, no nuchal rigidity, no LAD  CARD: RRR; S1 and S2 appreciated; no murmurs, no clicks, no rubs, no gallops RESP: Normal chest excursion without splinting or tachypnea; breath sounds clear and equal bilaterally; no wheezes, no  rhonchi, no rales, no hypoxia or respiratory distress, speaking full sentences ABD/GI: Normal bowel sounds; non-distended; soft, non-tender, no rebound, no guarding, no peritoneal signs, no hepatosplenomegaly BACK:  The back appears normal and is non-tender to palpation, there is no CVA tenderness EXT: Normal ROM in all joints; non-tender to palpation; no edema; normal capillary refill; no cyanosis, no calf tenderness or swelling    SKIN: Normal color for age and race; warm; no rash, no urticaria NEURO: Moves all extremities equally, sensation to light touch intact diffusely, cranial nerves II through XII intact, normal speech PSYCH: The patient's mood and manner are appropriate. Grooming and personal hygiene are appropriate.  MEDICAL DECISION MAKING: Patient here with persistent shortness of breath. Labs obtained in triage are unremarkable other than a positive d-dimer. Chest x-ray clear. Troponin negative. No significant anemia. Lungs are clear to auscultation with no wheezing, rhonchi or rales. We'll obtain CT of her chest to rule out pulmonary embolus.  ED PROGRESS: CT scan shows no pulmonary embolus, dissection or other acute abnormality. No infiltrate or edema. I feel she is safe to be discharged home. No hypoxia in the ED. No increased work of breathing. She is concerned this could still be sequela of her allergic reaction. Will discharge on a steroid taper. Will discharge without dural inhaler as well. Recommended close outpatient follow-up. She is no longer taking her birth control.   At this time, I do not feel there is any life-threatening condition present. I have reviewed and discussed all results (EKG, imaging, lab, urine as appropriate) and exam findings with patient/family. I have reviewed nursing notes and appropriate previous records.  I feel the patient is safe to be discharged home without further emergent workup and can continue workup as an outpatient as needed. Discussed usual and  customary return precautions. Patient/family verbalize understanding and are comfortable with this plan.  Outpatient follow-up has been provided. All questions have been answered.   EKG Interpretation  Date/Time:  Thursday June 26 2016 22:24:01 EST Ventricular Rate:  97 PR Interval:  126 QRS Duration: 74 QT Interval:  334 QTC Calculation: 424 R Axis:   40 Text Interpretation:  Normal sinus rhythm Normal ECG No old tracing to compare Confirmed by WARD,  DO, KRISTEN ([id number]) on 06/27/2016 2:05:41 AM      I personally performed the services described in this documentation, which was scribed in my presence. The recorded information has been reviewed and is accurate.     Layla Maw Ward, DO 06/27/16 [phone]

## 2016-06-27 NOTE — ED Notes (Signed)
Patient transported to CT 

## 2016-11-29 ENCOUNTER — Emergency Department (HOSPITAL_BASED_OUTPATIENT_CLINIC_OR_DEPARTMENT_OTHER)
Admission: EM | Admit: 2016-11-29 | Discharge: 2016-11-29 | Disposition: A | Discharge status: Home / Self Care | Payer: No Typology Code available for payment source | Attending: Emergency Medicine | Admitting: Emergency Medicine

## 2016-11-29 ENCOUNTER — Encounter (HOSPITAL_BASED_OUTPATIENT_CLINIC_OR_DEPARTMENT_OTHER): Payer: Self-pay | Admitting: Emergency Medicine

## 2016-11-29 ENCOUNTER — Emergency Department (HOSPITAL_BASED_OUTPATIENT_CLINIC_OR_DEPARTMENT_OTHER): Payer: No Typology Code available for payment source

## 2016-11-29 DIAGNOSIS — Y9389 Activity, other specified: Secondary | ICD-10-CM | POA: Diagnosis not present

## 2016-11-29 DIAGNOSIS — S20219A Contusion of unspecified front wall of thorax, initial encounter: Secondary | ICD-10-CM | POA: Insufficient documentation

## 2016-11-29 DIAGNOSIS — S93601A Unspecified sprain of right foot, initial encounter: Secondary | ICD-10-CM | POA: Insufficient documentation

## 2016-11-29 DIAGNOSIS — Y92410 Unspecified street and highway as the place of occurrence of the external cause: Secondary | ICD-10-CM | POA: Insufficient documentation

## 2016-11-29 DIAGNOSIS — S99921A Unspecified injury of right foot, initial encounter: Secondary | ICD-10-CM | POA: Diagnosis present

## 2016-11-29 DIAGNOSIS — Y999 Unspecified external cause status: Secondary | ICD-10-CM | POA: Insufficient documentation

## 2016-11-29 DIAGNOSIS — Z79899 Other long term (current) drug therapy: Secondary | ICD-10-CM | POA: Insufficient documentation

## 2016-11-29 LAB — PREGNANCY, URINE: PREG TEST UR: NEGATIVE

## 2016-11-29 MED ORDER — ACETAMINOPHEN 500 MG PO TABS
1000.0000 mg | ORAL_TABLET | Freq: Once | ORAL | Status: AC
  Administered 2016-11-29: 1000 mg via ORAL
  Filled 2016-11-29: qty 2

## 2016-11-29 NOTE — ED Notes (Signed)
Patient transported to X-ray 
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
## 2017-02-13 ENCOUNTER — Telehealth (INDEPENDENT_AMBULATORY_CARE_PROVIDER_SITE_OTHER): Payer: Self-pay | Admitting: Orthopaedic Surgery

## 2017-02-13 NOTE — Telephone Encounter (Signed)
I received VM from [place] @ [facility] checking status of request for records . I called back and advised that we do note have their request and I verified both our fax numbers.
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
